# Patient Record
Sex: Female | Born: 1965 | Race: White | Hispanic: No | Marital: Married
Health system: Midwestern US, Community
[De-identification: ages and names within clinical notes are randomized; demographics above are authoritative.]

## PROBLEM LIST (undated history)

## (undated) DIAGNOSIS — E785 Hyperlipidemia, unspecified: Secondary | ICD-10-CM

## (undated) DIAGNOSIS — M25562 Pain in left knee: Secondary | ICD-10-CM

---

## 1997-10-07 ENCOUNTER — Other Ambulatory Visit: Admission: RE | Admit: 1997-10-07 | Discharge: 1997-10-07 | Payer: Self-pay | Admitting: Gynecology

## 1998-06-23 ENCOUNTER — Ambulatory Visit (HOSPITAL_COMMUNITY): Admission: RE | Admit: 1998-06-23 | Discharge: 1998-06-23 | Payer: Self-pay | Admitting: Surgery

## 1998-07-11 ENCOUNTER — Ambulatory Visit (HOSPITAL_BASED_OUTPATIENT_CLINIC_OR_DEPARTMENT_OTHER): Admission: RE | Admit: 1998-07-11 | Discharge: 1998-07-11 | Payer: Self-pay | Admitting: Surgery

## 1998-10-21 ENCOUNTER — Other Ambulatory Visit: Admission: RE | Admit: 1998-10-21 | Discharge: 1998-10-21 | Payer: Self-pay | Admitting: Gynecology

## 1999-12-29 ENCOUNTER — Other Ambulatory Visit: Admission: RE | Admit: 1999-12-29 | Discharge: 1999-12-29 | Payer: Self-pay | Admitting: Gynecology

## 2000-05-11 ENCOUNTER — Encounter: Payer: Self-pay | Admitting: Gynecology

## 2000-05-11 ENCOUNTER — Ambulatory Visit (HOSPITAL_COMMUNITY): Admission: RE | Admit: 2000-05-11 | Discharge: 2000-05-11 | Payer: Self-pay | Admitting: Gynecology

## 2001-01-18 ENCOUNTER — Other Ambulatory Visit: Admission: RE | Admit: 2001-01-18 | Discharge: 2001-01-18 | Payer: Self-pay | Admitting: Gynecology

## 2002-05-08 ENCOUNTER — Other Ambulatory Visit: Admission: RE | Admit: 2002-05-08 | Discharge: 2002-05-08 | Payer: Self-pay | Admitting: Gynecology

## 2003-07-15 ENCOUNTER — Other Ambulatory Visit: Admission: RE | Admit: 2003-07-15 | Discharge: 2003-07-15 | Payer: Self-pay | Admitting: Gynecology

## 2004-06-11 ENCOUNTER — Encounter: Admission: RE | Admit: 2004-06-11 | Discharge: 2004-06-11 | Payer: Self-pay | Admitting: Gynecology

## 2005-05-25 ENCOUNTER — Other Ambulatory Visit: Admission: RE | Admit: 2005-05-25 | Discharge: 2005-05-25 | Payer: Self-pay | Admitting: Gynecology

## 2007-08-21 ENCOUNTER — Other Ambulatory Visit: Admission: RE | Admit: 2007-08-21 | Discharge: 2007-08-21 | Payer: Self-pay | Admitting: Gynecology

## 2007-10-25 ENCOUNTER — Encounter: Admission: RE | Admit: 2007-10-25 | Discharge: 2007-10-25 | Payer: Self-pay | Admitting: Gynecology

## 2009-01-28 ENCOUNTER — Encounter: Admission: RE | Admit: 2009-01-28 | Discharge: 2009-01-28 | Payer: Self-pay | Admitting: Gynecology

## 2010-04-14 IMAGING — MG MM DIAGNOSTIC BILATERAL
7 series · 7 of 7 positions shown · non-contrast
Comparison: 10/25/2007, 06/11/2004, 06/23/1998.

CLINICAL DATA: The patient is due for annual examination.  She has
had left axillary tenderness for 2-3 days. The patient reports a
mild rash recently on the upper left arm.  She denies any skin
changes of the left axilla.  She reports a fairly recent 15 pound
weight loss with a decrease in her bra size.

DIGITAL DIAGNOSTIC  BILATERAL  MAMMOGRAM  WITH CAD AND BILATERAL
BREAST ULTRASOUND:

[R CC]
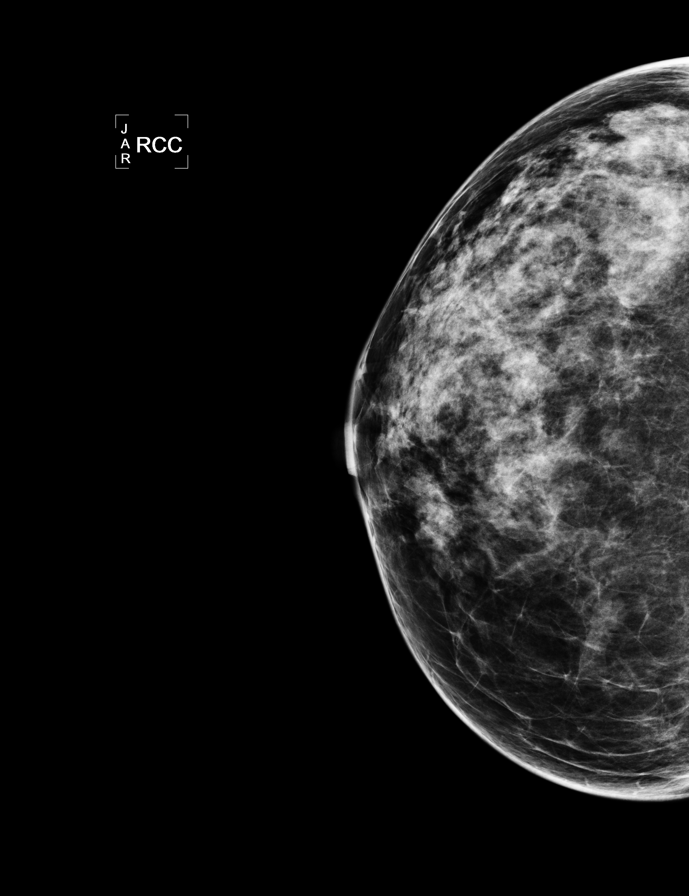

[L CC (1 of 3)]
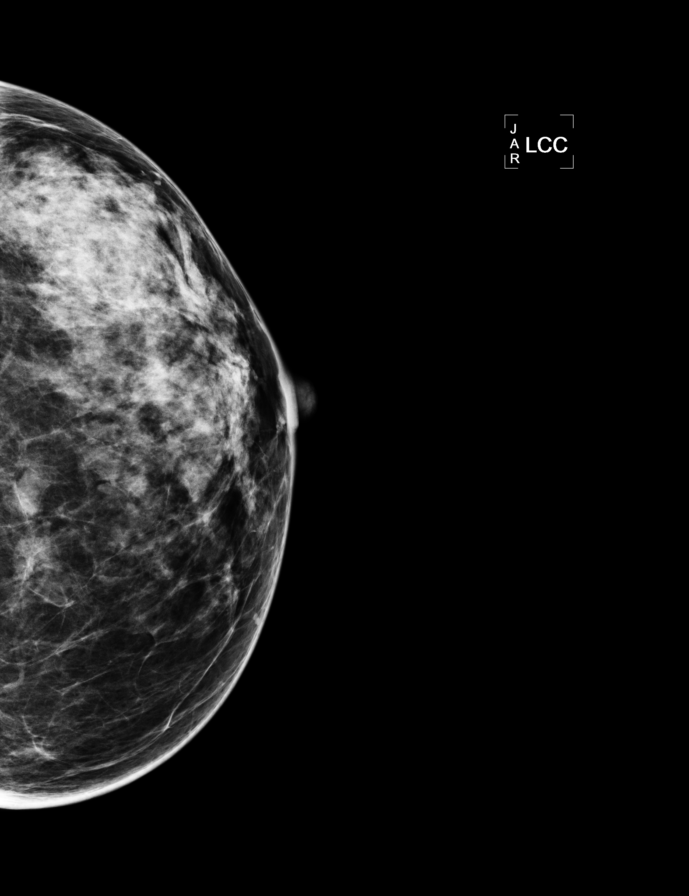

[L CC (2 of 3)]
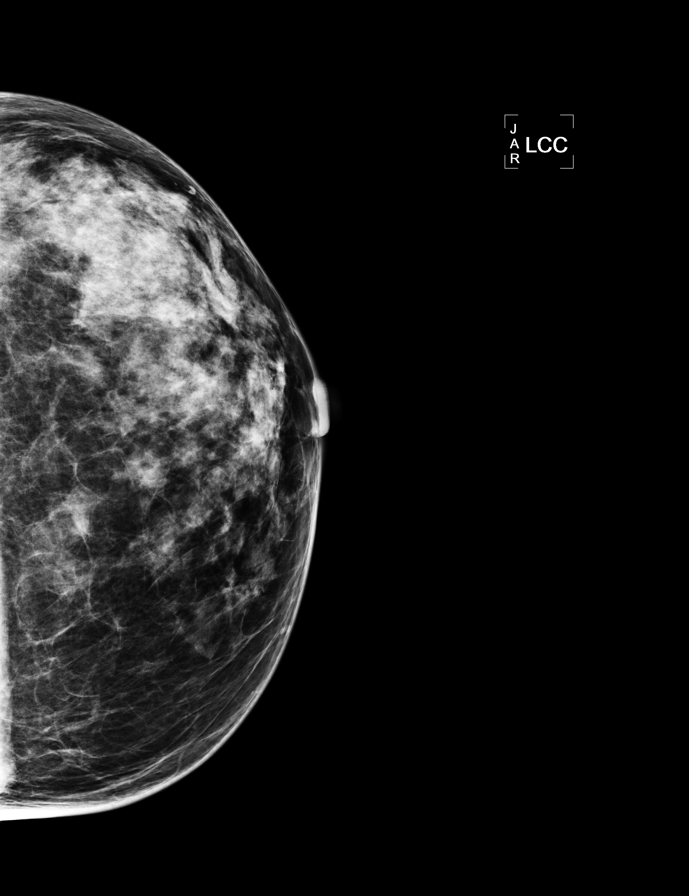

[L MLO]
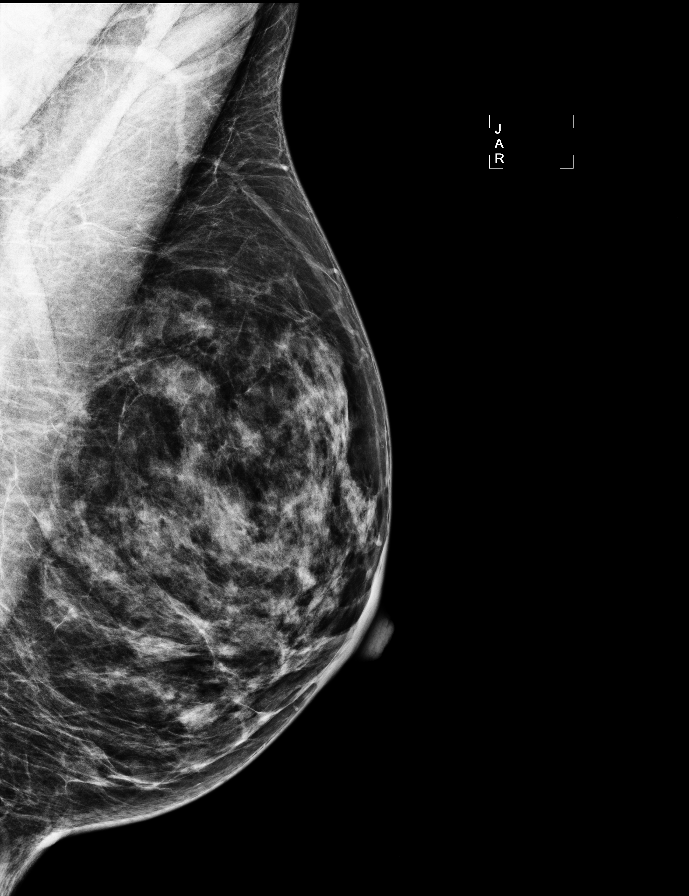

[R MLO]
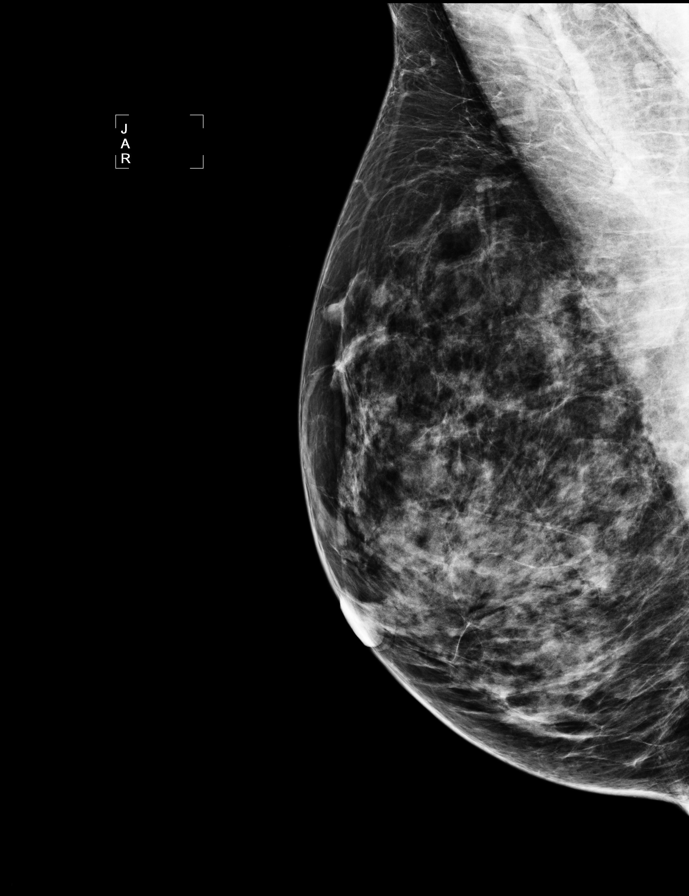

[L TAN]
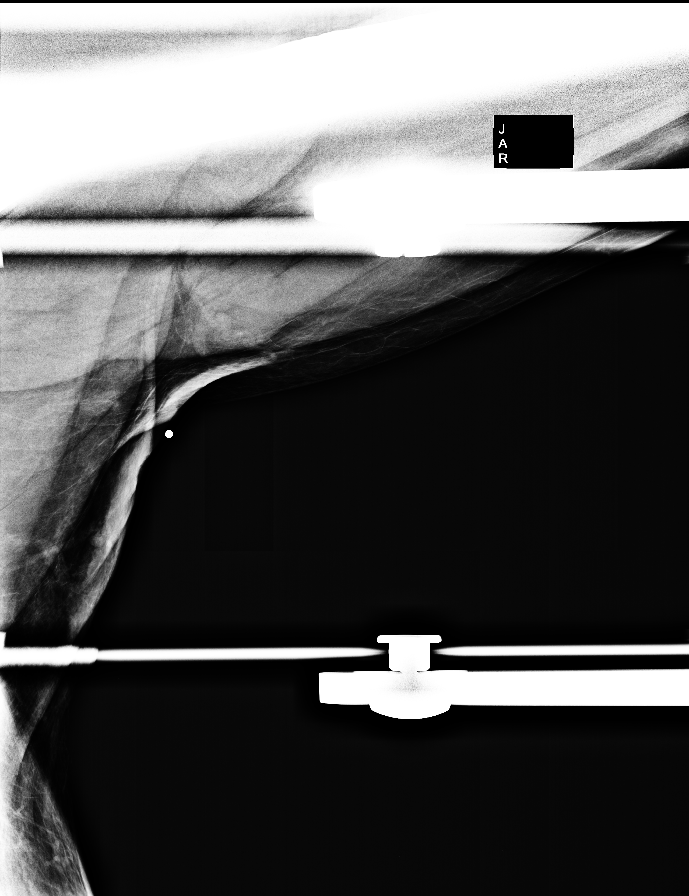

[L CC (3 of 3)]
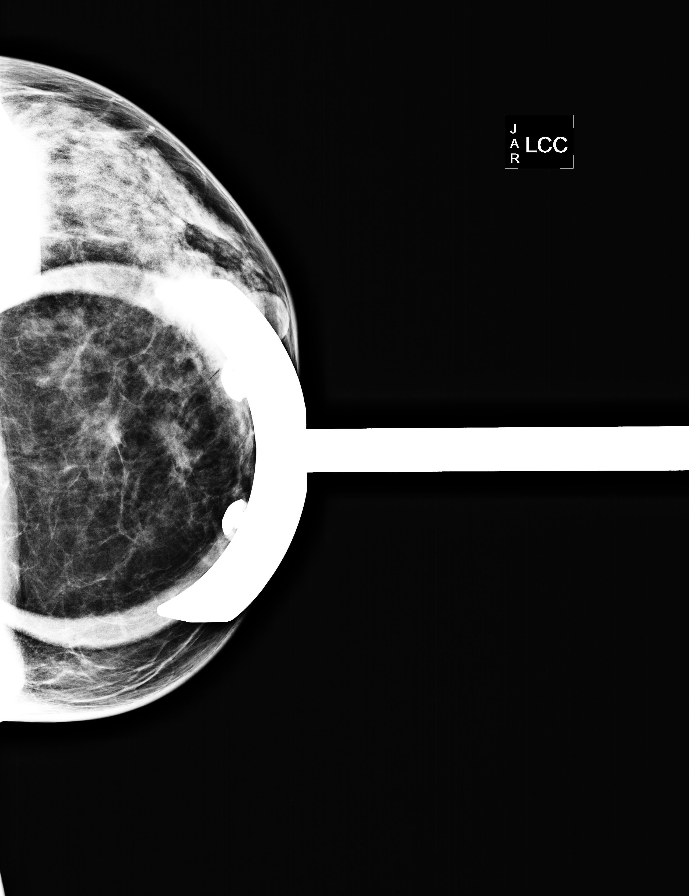

[7 of 7 positions shown; findings below may reference images not displayed]

FINDINGS: The breast parenchyma is heterogeneously dense
bilaterally.  An asymmetry in the medial left breast was evaluated
with focal spot compression views, which shows dispersion of normal
appearing fibroglandular breast tissue, without with persistent
mass.  No mass, distortion, or suspicious microcalcification is
identified in either breast.  A spot compression view of the left
axilla shows no lymphadenopathy or mass.

Mammographic images were processed with CAD.

On physical exam, no mass is palpated in the medial left breast, or
within the left axilla.  The skin of the left axilla appears
unremarkable.  There are a few sub-centimeter slightly erythematous
non- raised areas on the skin of the upper left arm in the region
the patient reports a recent rash.

Ultrasound is performed, showing normal fibroglandular breast
tissue in the medial left breast.  In the left axilla, there are a
few lymph nodes that are not pathologically enlarged, but
demonstrate mildly thickened cortices. One of these lymph nodes
measures 7 mm in short axis and maintains a fatty hilum but has a
mildly thickened cortex.  Another lymph node with a thickened
cortex also measures 7 mm in short axis.  With imaging directly
over the region of patient tenderness, normal appearing muscle is
identified.

For comparison purposes, ultrasound is performed of the right
axilla which demonstrates a 3 mm short axis lymph node.
IMPRESSION: 1. Mildly thickened cortices of a few left axillary lymph nodes.
These are likely reactive lymph nodes.  No pathologically enlarged
lymph node is identified.  No suspicious findings are identified
directly over the region of patient tenderness in the left axilla.
A follow-up left axillary ultrasound is suggested in 6 months.
2.  No evidence of malignancy is identified in either breast.

BI-RADS CATEGORY 3:  Probably benign finding(s) - short interval
follow-up suggested.

## 2010-06-21 ENCOUNTER — Encounter: Payer: Self-pay | Admitting: Gynecology

## 2011-12-09 ENCOUNTER — Other Ambulatory Visit: Payer: Self-pay | Admitting: Gynecology

## 2013-04-11 ENCOUNTER — Other Ambulatory Visit: Payer: Self-pay | Admitting: Gynecology

## 2014-07-01 ENCOUNTER — Other Ambulatory Visit: Payer: Self-pay | Admitting: Gynecology

## 2014-07-02 LAB — CYTOLOGY - PAP

## 2019-03-20 ENCOUNTER — Other Ambulatory Visit: Payer: Self-pay

## 2019-03-20 ENCOUNTER — Encounter: Payer: Self-pay | Admitting: Emergency Medicine

## 2019-03-20 ENCOUNTER — Emergency Department
Admission: EM | Admit: 2019-03-20 | Discharge: 2019-03-20 | Disposition: A | Discharge status: Home / Self Care | Payer: BC Managed Care – PPO | Source: Home / Self Care | Attending: Emergency Medicine | Admitting: Emergency Medicine

## 2019-03-20 ENCOUNTER — Emergency Department (INDEPENDENT_AMBULATORY_CARE_PROVIDER_SITE_OTHER): Payer: BC Managed Care – PPO

## 2019-03-20 DIAGNOSIS — S9031XA Contusion of right foot, initial encounter: Secondary | ICD-10-CM

## 2019-03-20 DIAGNOSIS — M19071 Primary osteoarthritis, right ankle and foot: Secondary | ICD-10-CM

## 2019-03-20 DIAGNOSIS — M79671 Pain in right foot: Secondary | ICD-10-CM | POA: Diagnosis not present

## 2019-03-20 HISTORY — DX: Hyperlipidemia, unspecified: E78.5

## 2019-03-20 NOTE — ED Provider Notes (Signed)
[facility]    CSN: [id number] Arrival date & time: 03/20/19  7371      History   Chief Complaint Chief Complaint  Patient presents with  . Foot Pain    HPI MADA SADIK is a 53 y.o. female.   HPI Pain dorsum right foot, forefoot and midfoot, that started a few weeks ago. No known injury. PT suspects a stress fracture. Area is swollen and swelling worsens throughout the day. No history of gout.   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 Reviewed again that she recalls no specific injury, although she has used her treadmill frequently to exercise. Denies ankle pain.  No fever or chills or nausea or vomiting.  No wound or skin lesion. Denies history of gout. She requests an x-ray to rule out acute fracture or stress fracture. Past Medical History:  Diagnosis Date  . Hyperlipidemia     There are no active problems to display for this patient.   Past Surgical History:  Procedure Laterality Date  . FRACTURE SURGERY      OB History   No obstetric history on file.    No LMP recorded. Patient is postmenopausal. She denies chance of pregnancy  Home Medications    Prior to Admission medications   Medication Sig Start Date End Date Taking? Authorizing Provider  levothyroxine (SYNTHROID) 125 MCG tablet Take 125 mcg by mouth daily before breakfast.   Yes [provider]    Family History No family history on file.  Social History Social History   Tobacco Use  . Smoking status: Not on file  Substance Use Topics  . Alcohol use: Not on file  . Drug use: Not on file     Allergies   Penicillins, Levaquin [levofloxacin], Oxycodone, and Sulfa antibiotics   Review of Systems Review of Systems  All other systems reviewed and are negative.    Physical Exam  Triage Vital Signs ED Triage Vitals  Enc Vitals Group     BP 03/20/19 0859 115/83     Pulse Rate 03/20/19 0859 69     Resp 03/20/19 0859 16     Temp 03/20/19 0859 98.6 F (37 C)     Temp Source 03/20/19 0859 Oral     SpO2 03/20/19 0859 97 %     Weight --      Height --      Head Circumference --      Peak Flow --      Pain Score 03/20/19 0852 7     Pain Loc --      Pain Edu? --      Excl. in [place]? --    No data found.  Updated Vital Signs BP 115/83   Pulse 69   Temp 98.6 F (37 C) (Oral)   Resp 16   SpO2 97%    Physical Exam Vitals signs reviewed.  Constitutional:      General: She is not in acute distress.    Appearance: She is well-developed.  HENT:     Head: Normocephalic and atraumatic.  Eyes:     General: No scleral icterus.    Pupils: Pupils are equal, round, and reactive to light.  Neck:     Musculoskeletal: Normal range of motion and neck supple.  Cardiovascular:     Rate and Rhythm: Normal rate and regular rhythm.  Pulmonary:     Effort: Pulmonary effort is normal.  Abdominal:     General: There is no distension.  Musculoskeletal:     Right ankle: Normal. She exhibits normal range of motion. No tenderness. Achilles tendon exhibits no pain and normal Thompson's test results.     Right foot: Decreased range of motion. Normal capillary refill. Tenderness, bony tenderness and swelling present. No deformity or laceration.       Feet:  Skin:    General: Skin is warm and dry.     Findings: No rash.  Neurological:     Mental Status: She is alert and oriented to person, place, and time.     Cranial Nerves: No cranial nerve deficit.  Psychiatric:        Behavior: Behavior normal.    9:13 AM  x-ray right foot ordered.  Patient agrees  UC Treatments / Results  Labs (all labs ordered are listed, but only abnormal results are displayed) Labs Reviewed - No data to display  EKG   Radiology Dg Foot Complete Right  Result Date: 03/20/2019 CLINICAL  DATA:  Right foot pain EXAM: RIGHT FOOT COMPLETE - 3+ VIEW COMPARISON:  None. FINDINGS: Moderate degenerative changes at the 1st MTP joint with joint space narrowing and spurring. No acute bony abnormality. Specifically, no fracture, subluxation, or dislocation. 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 No acute bony abnormality. Electronically Signed   By: Charlett Nose M.D.   On: 03/20/2019 10:01    Procedures Procedures (including critical care time)  Medications Ordered in UC Medications - No data to display  Initial Impression / Assessment and Plan / UC Course  I have reviewed the triage vital signs and the nursing notes.  Pertinent labs & imaging results that were available during my care of the patient were reviewed by me and considered in my medical decision making (see chart for details).    X-ray negative for acute abnormality.  There are degenerative changes first MTPJ consistent with prior foot surgery in this area 20 years ago.  However she is nontender over first MTPJ.  Discussed with patient. -Explained that there is no sign of fracture.  I explained there is always a possibility of a hairline stress fracture that might not show up on regular x-ray, and that other imaging might be needed if pain persisted. Treatment options discussed.  She agreed with the following: Treat with Ace bandage and postop shoe applied right foot.  She declined any prescription pain med and she prefers to use ibuprofen.  Follow-up with PCP, sports medicine or orthopedist if no better 1-2 weeks.-Follow-up sooner if worse or new symptoms.  Precautions discussed. Red flags discussed. Questions invited and answered. Patient voiced understanding and agreement.   Final Clinical Impressions(s) / UC Diagnoses   Final diagnoses:  Foot pain, right  Contusion of right foot, initial encounter   Discharge Instructions   None    ED Prescriptions    None     PDMP not reviewed this  encounter.   Lajean Manes, MD 03/21/19 1430

## 2019-03-20 NOTE — ED Triage Notes (Signed)
Right foot pain that started a few weeks ago. No known injury. PT suspects a stress fracture. Area is swollen and swelling worsens throughout the day. No history of gout. Had surgery on this foot 20 years ago

## 2020-06-03 IMAGING — DX DG FOOT COMPLETE 3+V*R*
3 series · 3 of 3 positions shown · non-contrast
Comparison: None.

CLINICAL DATA: Right foot pain

EXAM:
RIGHT FOOT COMPLETE - 3+ VIEW

[foot ap]
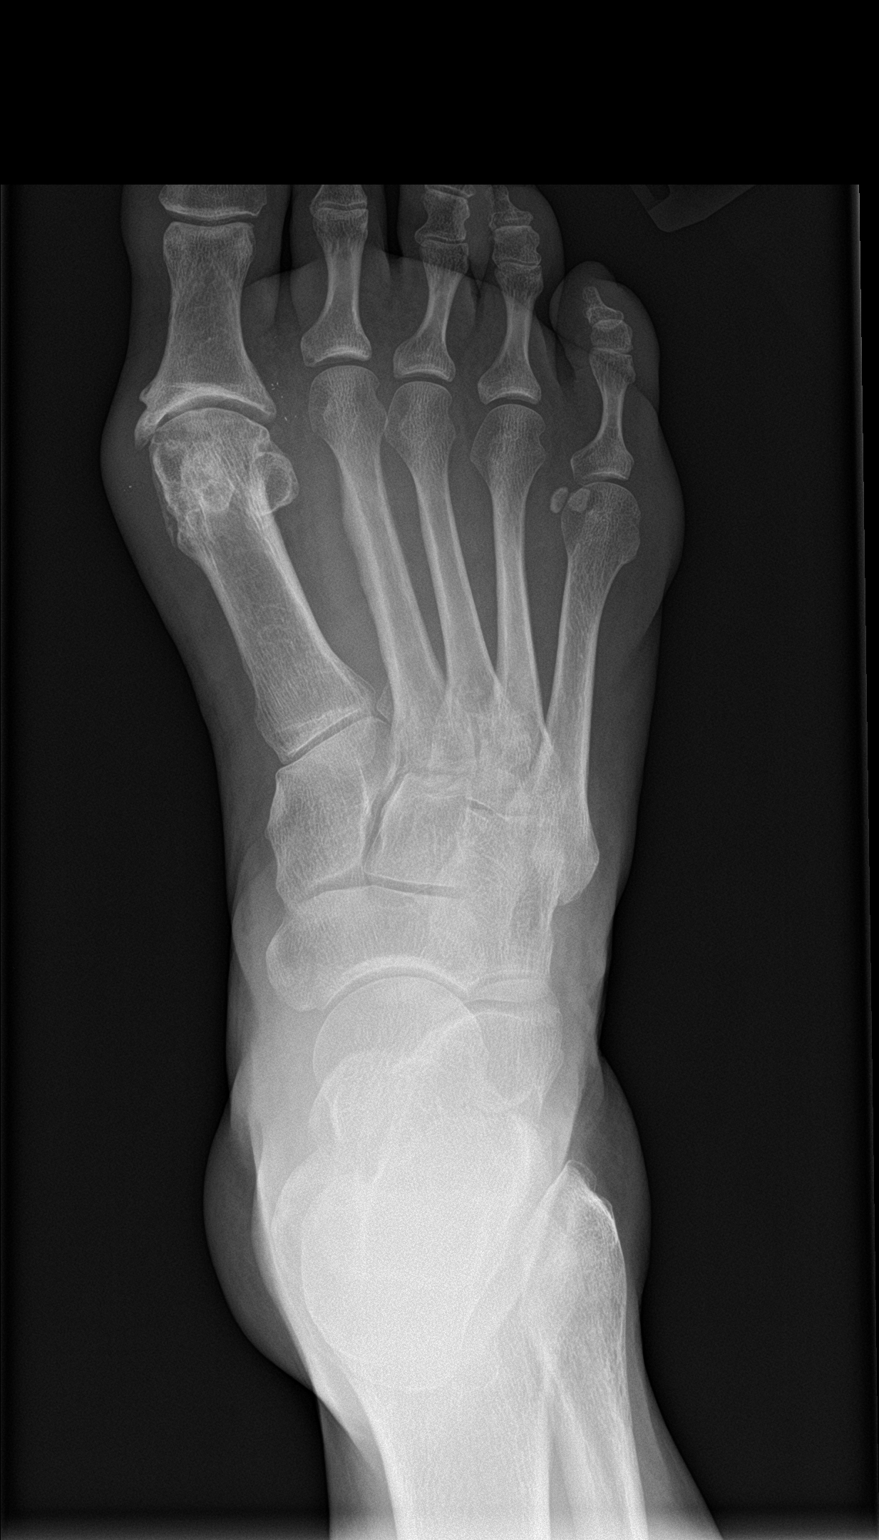

[foot obl]
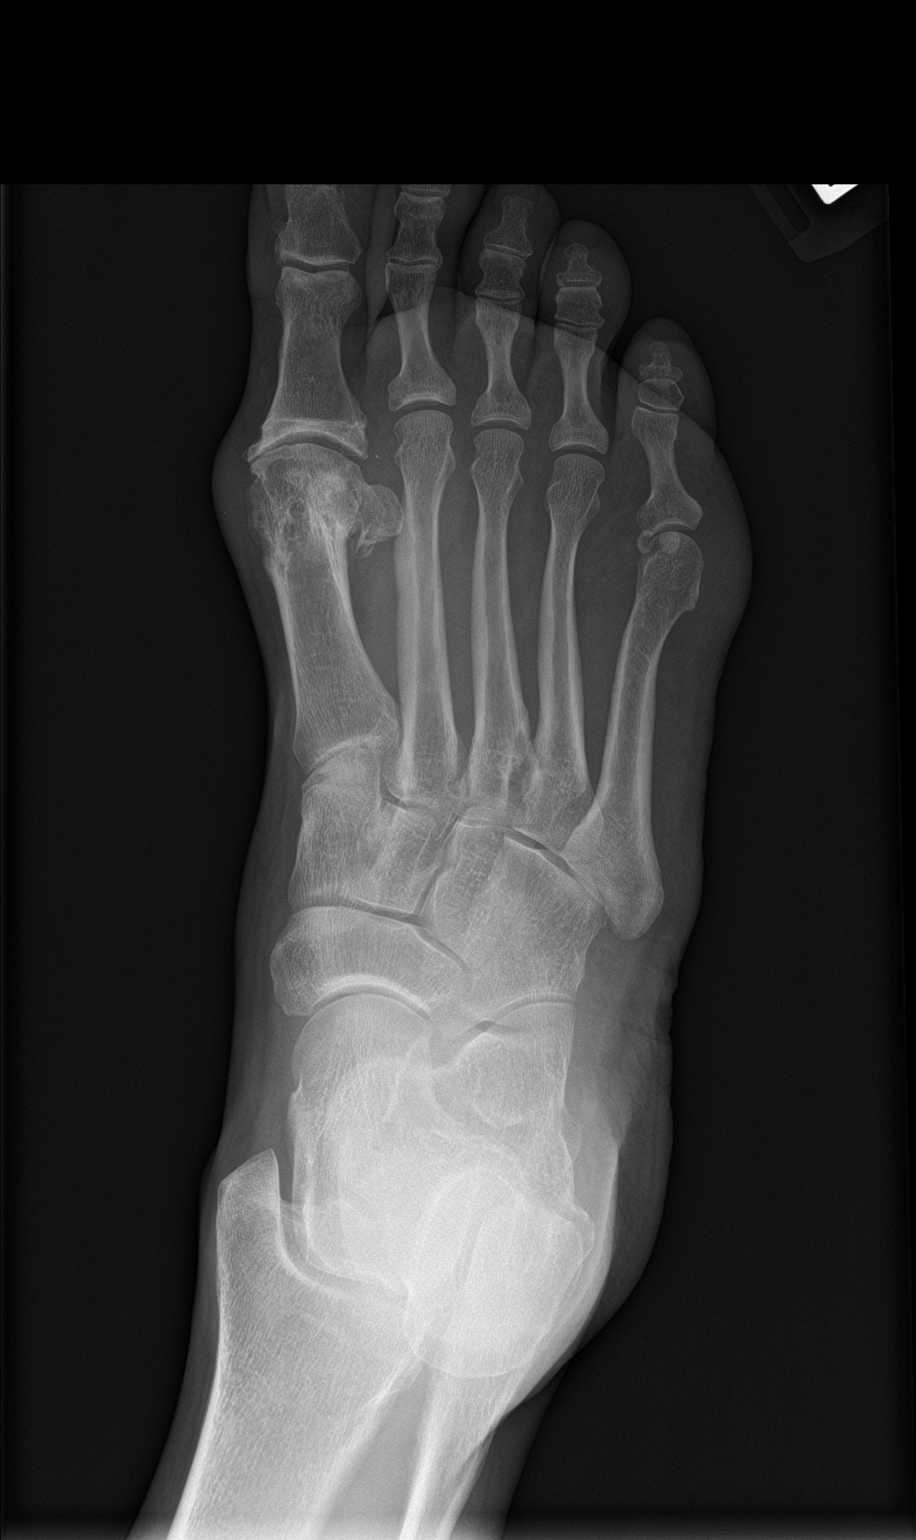

[foot lat]
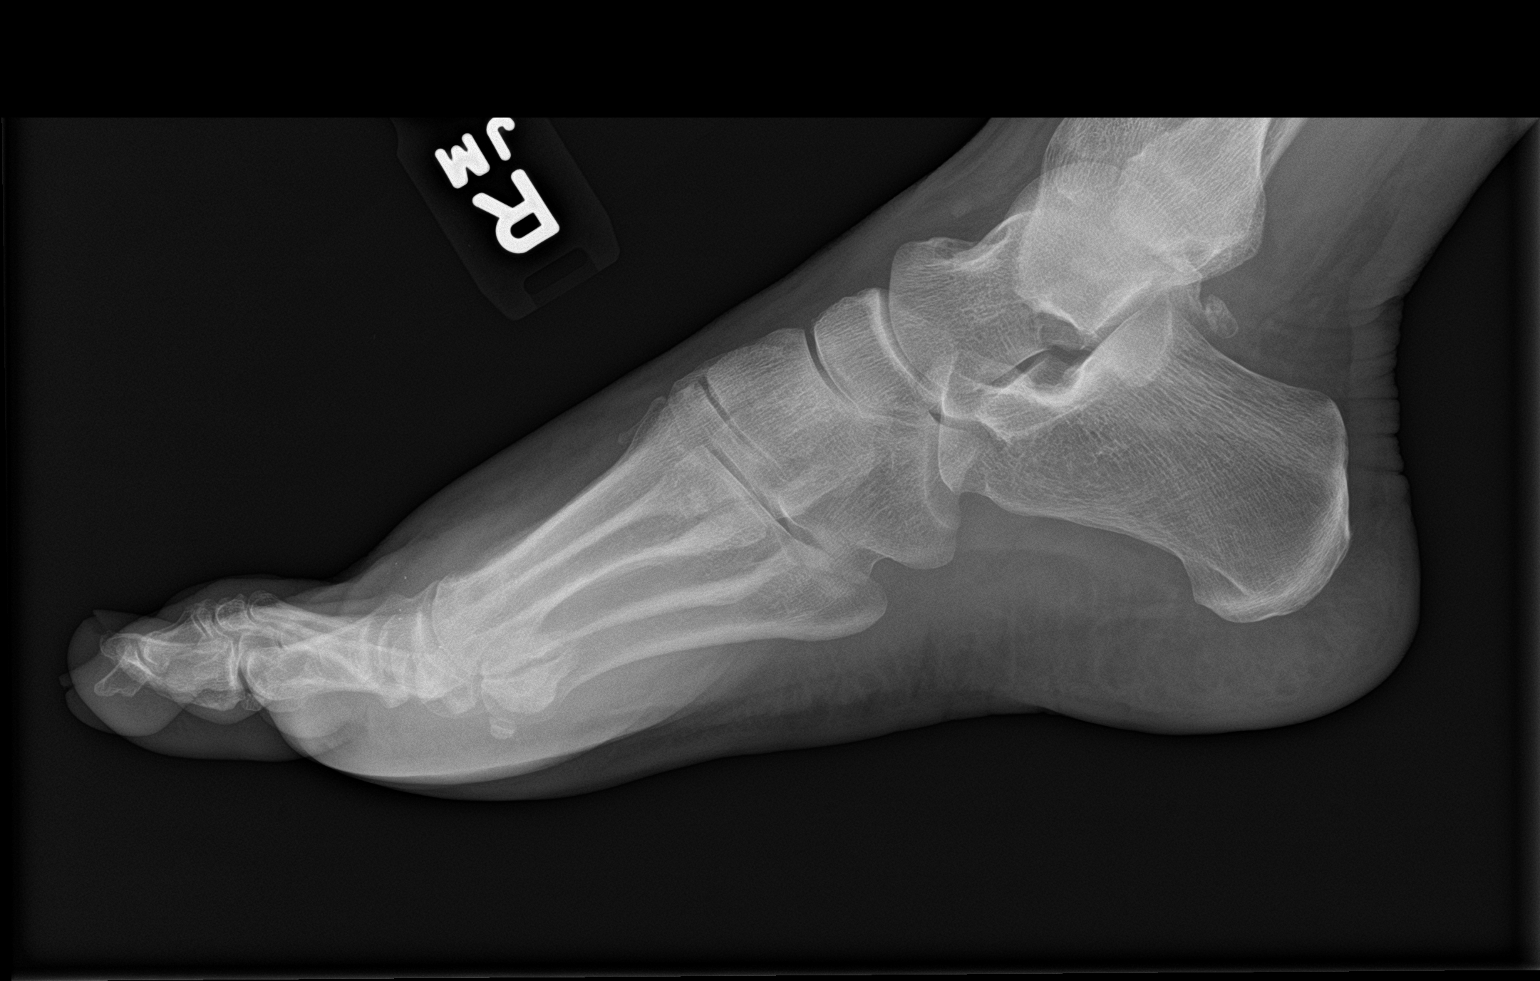

[3 of 3 positions shown; findings below may reference images not displayed]

FINDINGS: Moderate degenerative changes at the 1st MTP joint with joint space
narrowing and spurring. No acute bony abnormality. Specifically, no
fracture, subluxation, or dislocation.
IMPRESSION: Moderate osteoarthritis at the 1st MTP joint. No acute bony
abnormality.

## 2020-07-03 ENCOUNTER — Ambulatory Visit: Attending: Orthopaedic Surgery | Primary: Internal Medicine

## 2020-07-03 ENCOUNTER — Encounter

## 2020-07-03 ENCOUNTER — Ambulatory Visit
Admit: 2020-07-03 | Discharge: 2020-07-03 | Payer: BLUE CROSS/BLUE SHIELD | Attending: Orthopaedic Surgery | Primary: Internal Medicine

## 2020-07-03 ENCOUNTER — Encounter: Admit: 2020-07-03 | Primary: Internal Medicine

## 2020-07-03 DIAGNOSIS — M25562 Pain in left knee: Secondary | ICD-10-CM

## 2020-07-03 NOTE — Progress Notes (Signed)
Name: Helen Myers    DOB: 04-Mar-1966     Service Dept: [facility]    Chief Complaint   Patient presents with   ??? Knee Pain        Visit Vitals  Ht 5\' 9"  (1.753 m)   Wt 158 lb (71.7 kg)   BMI 23.33 kg/m??        Allergies   Allergen Reactions   ??? Levofloxacin Rash   ??? Metronidazole Rash   ??? Oxycodone Rash   ??? Penicillins Hives and Rash   ??? Sulfa (Sulfonamide Antibiotics) Rash   ??? Sulfasalazine Rash   ??? Oxycodone-Acetaminophen Other (comments)     Pt states hypes her up.          Current Outpatient Medications   Medication Sig Dispense Refill   ??? estradioL (ESTRACE) 0.01 % (0.1 mg/gram) vaginal cream estradiol 0.01% (0.1 mg/gram) vaginal cream     ??? levothyroxine (SYNTHROID) 25 mcg tablet levothyroxine 25 mcg tablet     ??? levothyroxine (SYNTHROID) 125 mcg tablet Take  by mouth Daily (before breakfast).     ??? predniSONE (DELTASONE) 20 mg tablet         Patient Active Problem List   Diagnosis Code   ??? Closed nondisplaced fracture of second metatarsal bone of right foot S92.324A   ??? 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 Hallux valgus of right foot M20.11   ??? Hypothyroidism E03.9      Family History   Problem Relation Age of Onset   ??? Cancer Mother    ??? Heart Disease Father    ??? Diabetes Father       Social History     Socioeconomic History   ??? Marital status: MARRIED   Tobacco Use   ??? Smoking status: Never Smoker   ??? Smokeless tobacco: Never Used   Substance and Sexual Activity   ??? Alcohol use: Yes      History reviewed. No pertinent surgical history.   Past Medical History:   Diagnosis Date   ??? Thyroid disease         I have reviewed and agree with PFSH and ROS and intake form in chart and the record furthermore I have reviewed prior medical record(s) regarding this patients care during this appointment.     Review of Systems:   Patient is a pleasant appearing individual, appropriately dressed, well hydrated, well nourished, who is alert, appropriately oriented for  age, and in no acute distress with a normal gait and normal affect who does not appear to be in any significant pain.   Physical Exam:  Left Knee -Decrease range of motion with flexion, Knee arc of greater than 50 degrees, Some crepitation, Grossly neurovascularly intact, Good cap refill, No skin lesion, Moderate swelling, No gross instability, Some quadriceps weakness, greater than 50 degree arc    Right Knee - Full Range of Motion, No crepitation, Grossly neurovascularly intact, Good cap refill, No skin lesion, No swelling, No gross instability, No quadriceps weakness    Procedure Documentation:    I discussed in detail the risks, benefits and complications of an injection which included but are not limited to infection, skin reactions, hot swollen joint, and anaphylaxis with the patient. The patient verbalized understanding and gave informed consent for the injection. The patient's knee was flexed to 90?? and the skin prepped using sterile alcohol solution. A sterile needle was inserted into the left knee and the  mixture of 9 mL Lidocaine 1%, 1 mL Kenalog 40 mg was injected under sterile technique. The needle was withdrawn and the puncture site sealed with a Band-Aid.      Technique: Under sterile conditions a GE ultrasound unit with a variable frequency (7.0-14.0 MHz) linear transducer was used to localize the placement of needle into the left knee joint.    Findings: Successful needle placement for knee injection.  Final images were taken and saved for permanent record.      The patient tolerated the injection well. The patient was instructed to call the office immediately if there is any pain, redness, warmth, fever, or chills.   Encounter Diagnoses     ICD-10-CM ICD-9-CM   1. Left knee pain, unspecified chronicity  M25.562 719.46       HPI:  The patient is here with a chief complaint of left knee pain, dull, throbbing pain, progressively getting worse.  Nothing has helped.  Using it makes it worse.  Pain is  5/10.    X-rays are unremarkable.    Assessment/Plan:  1.  Left knee pain that is not resolved with IT band friction syndrome and a little bit of arthritic flare.    Plan will be for cortisone injection today.  If it helps, it is all we need to do and we will go from there.      As part of continued conservative pain management options the patient was advised to utilize Tylenol or OTC NSAIDS as long as it is not medically contraindicated.     Return to Office:   Follow-up and Dispositions    ?? Return if symptoms worsen or fail to improve.           Scribed by Diona Foley, LPN as dictated by [facility] A. Allena Katz, MD.  Documentation True and Accepted Tabatha Razzano A. Allena Katz, MD
